# Patient Record
Sex: Male | Born: 1995 | Race: White | Hispanic: No | Marital: Single | Smoking: Never smoker
Health system: Southern US, Community
[De-identification: ages and names within clinical notes are randomized; demographics above are authoritative.]

## PROBLEM LIST (undated history)

## (undated) DIAGNOSIS — F39 Unspecified mood [affective] disorder: Secondary | ICD-10-CM

## (undated) DIAGNOSIS — F909 Attention-deficit hyperactivity disorder, unspecified type: Secondary | ICD-10-CM

## (undated) HISTORY — DX: Attention-deficit hyperactivity disorder, unspecified type: F90.9

## (undated) HISTORY — DX: Unspecified mood (affective) disorder: F39

---
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
## 2016-06-30 IMAGING — DX DG CHEST 2V
2 series · 2 of 2 positions shown · non-contrast
Comparison: 10/31/2013

CLINICAL DATA: Intermittent left-sided chest pain x1 year

EXAM:
CHEST  2 VIEW

[chest pa]
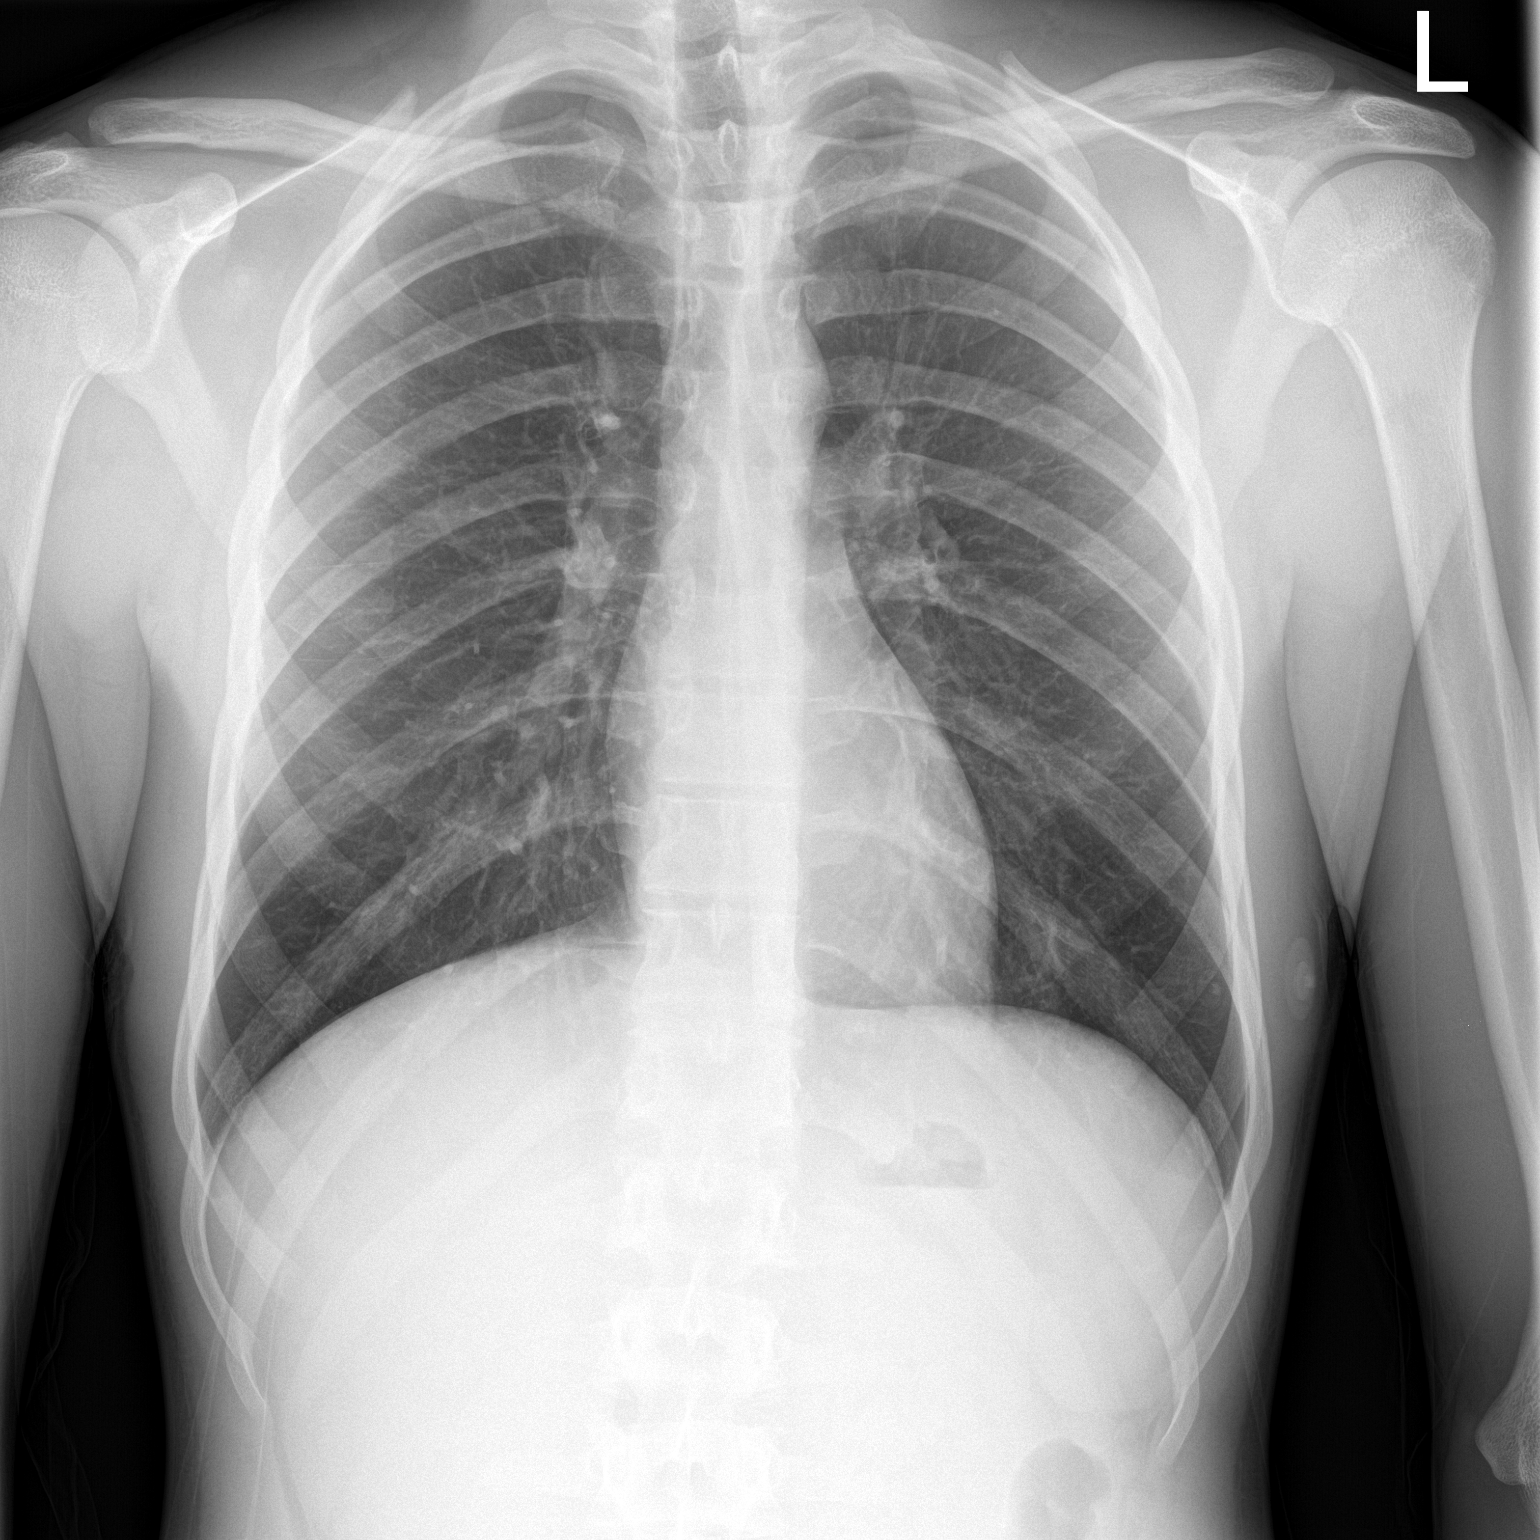

[chest lat]
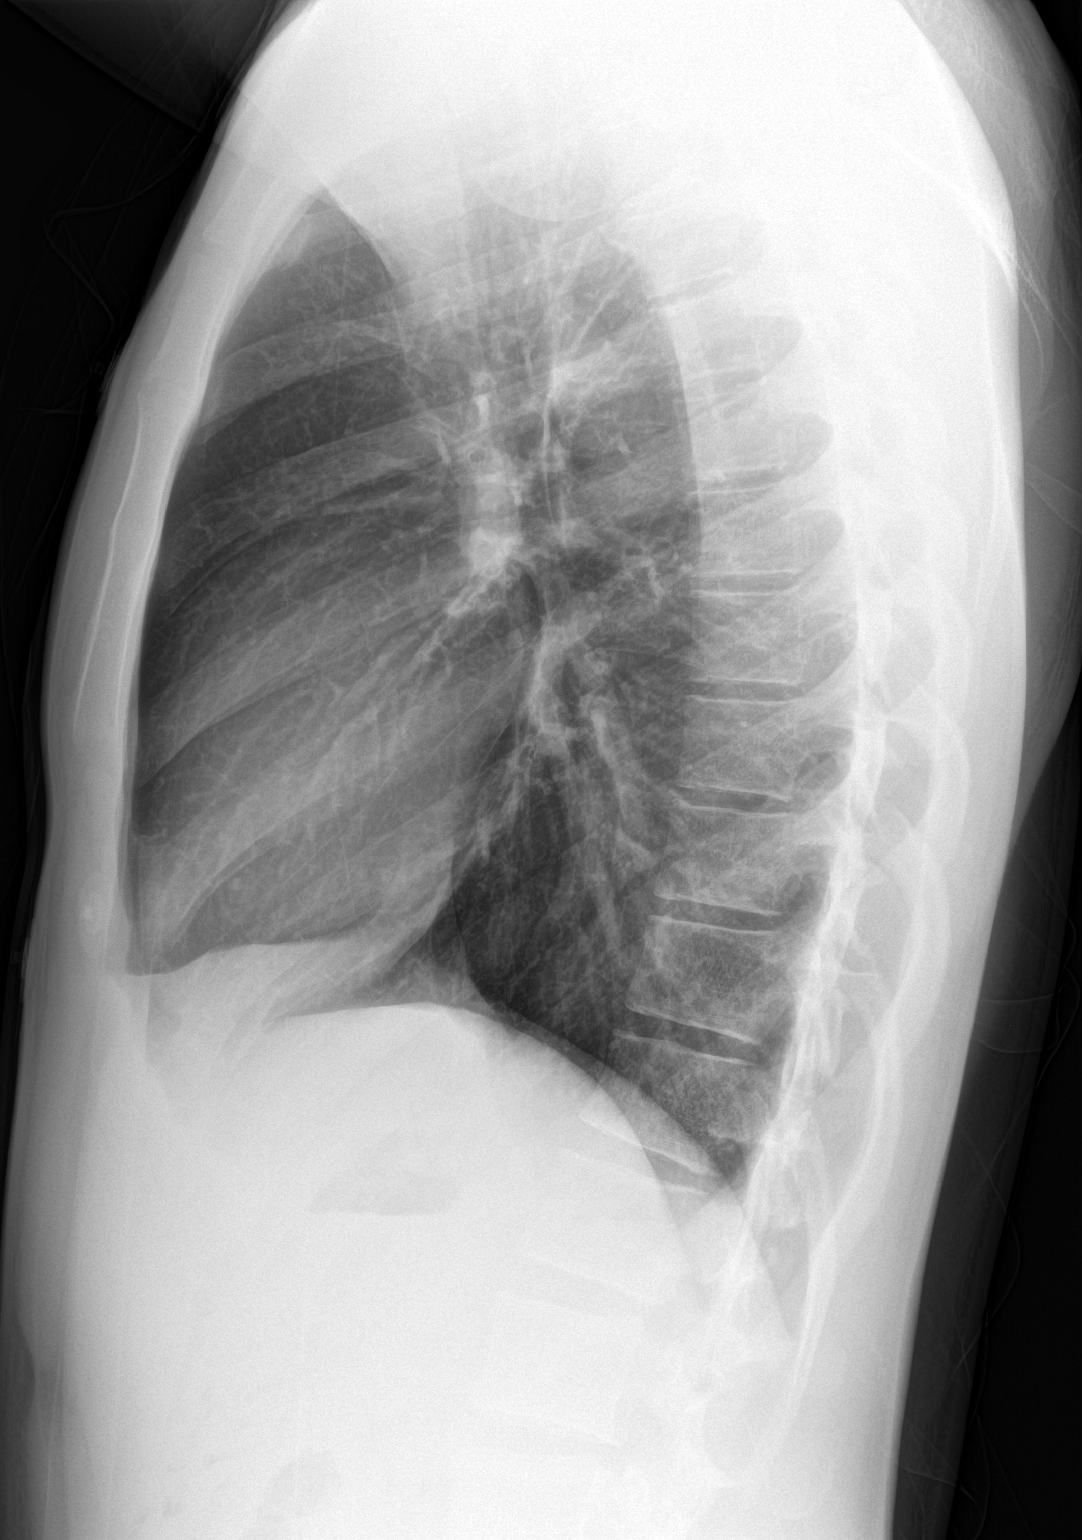

[2 of 2 positions shown; findings below may reference images not displayed]

FINDINGS: Lungs are clear.  No pleural effusion or pneumothorax.

The heart is normal in size.

Visualized osseous structures are within normal limits.
IMPRESSION: Normal chest radiographs.
